# Patient Record
Sex: Female | Born: 2000 | Race: Black or African American | Hispanic: No | Marital: Single | Smoking: Never smoker
Health system: Southern US, Community
[De-identification: ages and names within clinical notes are randomized; demographics above are authoritative.]

## PROBLEM LIST (undated history)

## (undated) ENCOUNTER — Emergency Department (HOSPITAL_COMMUNITY): Admission: EM | Source: Home / Self Care

## (undated) HISTORY — PX: HYMENECTOMY: SHX987

---
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
## 2020-04-08 DIAGNOSIS — F641 Gender identity disorder in adolescence and adulthood: Secondary | ICD-10-CM | POA: Diagnosis not present

## 2020-04-08 DIAGNOSIS — R69 Illness, unspecified: Secondary | ICD-10-CM | POA: Diagnosis not present

## 2020-05-06 DIAGNOSIS — F641 Gender identity disorder in adolescence and adulthood: Secondary | ICD-10-CM | POA: Diagnosis not present

## 2020-05-06 DIAGNOSIS — R69 Illness, unspecified: Secondary | ICD-10-CM | POA: Diagnosis not present

## 2020-06-06 DIAGNOSIS — R69 Illness, unspecified: Secondary | ICD-10-CM | POA: Diagnosis not present

## 2020-10-12 ENCOUNTER — Other Ambulatory Visit: Payer: Self-pay

## 2020-10-12 ENCOUNTER — Emergency Department (HOSPITAL_COMMUNITY)
Admission: EM | Admit: 2020-10-12 | Discharge: 2020-10-13 | Discharge status: Against Medical Advice | Payer: Federal, State, Local not specified - PPO

## 2020-10-13 ENCOUNTER — Emergency Department (HOSPITAL_COMMUNITY)
Admission: EM | Admit: 2020-10-13 | Discharge: 2020-10-13 | Disposition: A | Discharge status: Home / Self Care | Payer: Federal, State, Local not specified - PPO | Attending: Emergency Medicine | Admitting: Emergency Medicine

## 2020-10-13 ENCOUNTER — Encounter (HOSPITAL_COMMUNITY): Payer: Self-pay | Admitting: *Deleted

## 2020-10-13 DIAGNOSIS — Z5321 Procedure and treatment not carried out due to patient leaving prior to being seen by health care provider: Secondary | ICD-10-CM | POA: Insufficient documentation

## 2020-10-13 DIAGNOSIS — R109 Unspecified abdominal pain: Secondary | ICD-10-CM | POA: Insufficient documentation

## 2020-10-13 DIAGNOSIS — R112 Nausea with vomiting, unspecified: Secondary | ICD-10-CM | POA: Diagnosis present

## 2020-10-13 DIAGNOSIS — R197 Diarrhea, unspecified: Secondary | ICD-10-CM | POA: Diagnosis not present

## 2020-10-13 LAB — CBC WITH DIFFERENTIAL/PLATELET
Abs Immature Granulocytes: 0.04 10*3/uL (ref 0.00–0.07)
Basophils Absolute: 0 10*3/uL (ref 0.0–0.1)
Basophils Relative: 0 %
Eosinophils Absolute: 0 10*3/uL (ref 0.0–0.5)
Eosinophils Relative: 0 %
HCT: 45.1 % (ref 36.0–46.0)
Hemoglobin: 14.9 g/dL (ref 12.0–15.0)
Immature Granulocytes: 0 %
Lymphocytes Relative: 14 %
Lymphs Abs: 1.7 10*3/uL (ref 0.7–4.0)
MCH: 29.9 pg (ref 26.0–34.0)
MCHC: 33 g/dL (ref 30.0–36.0)
MCV: 90.6 fL (ref 80.0–100.0)
Monocytes Absolute: 1.2 10*3/uL — ABNORMAL HIGH (ref 0.1–1.0)
Monocytes Relative: 10 %
Neutro Abs: 9.4 10*3/uL — ABNORMAL HIGH (ref 1.7–7.7)
Neutrophils Relative %: 76 %
Platelets: 457 10*3/uL — ABNORMAL HIGH (ref 150–400)
RBC: 4.98 MIL/uL (ref 3.87–5.11)
RDW: 12.5 % (ref 11.5–15.5)
WBC: 12.3 10*3/uL — ABNORMAL HIGH (ref 4.0–10.5)
nRBC: 0 % (ref 0.0–0.2)

## 2020-10-13 LAB — I-STAT BETA HCG BLOOD, ED (MC, WL, AP ONLY): I-stat hCG, quantitative: <5 m[IU]/mL (ref <5)

## 2020-10-13 LAB — URINALYSIS, ROUTINE W REFLEX MICROSCOPIC
Bilirubin Urine: NEGATIVE
Glucose, UA: 50 mg/dL — AB
Ketones, ur: 80 mg/dL — AB
Leukocytes,Ua: NEGATIVE
Nitrite: NEGATIVE
Protein, ur: 100 mg/dL — AB
Specific Gravity, Urine: 1.034 — ABNORMAL HIGH (ref 1.005–1.030)
pH: 6 (ref 5.0–8.0)

## 2020-10-13 LAB — COMPREHENSIVE METABOLIC PANEL
ALT: 13 U/L (ref 0–44)
AST: 19 U/L (ref 15–41)
Albumin: 4.4 g/dL (ref 3.5–5.0)
Alkaline Phosphatase: 56 U/L (ref 38–126)
Anion gap: 13 (ref 5–15)
BUN: 15 mg/dL (ref 6–20)
CO2: 24 mmol/L (ref 22–32)
Calcium: 9.9 mg/dL (ref 8.9–10.3)
Chloride: 102 mmol/L (ref 98–111)
Creatinine, Ser: 0.88 mg/dL (ref 0.44–1.00)
GFR, Estimated: >60 mL/min (ref >60)
Glucose, Bld: 103 mg/dL — ABNORMAL HIGH (ref 70–99)
Potassium: 3.1 mmol/L — ABNORMAL LOW (ref 3.5–5.1)
Sodium: 139 mmol/L (ref 135–145)
Total Bilirubin: 1 mg/dL (ref 0.3–1.2)
Total Protein: 7.7 g/dL (ref 6.5–8.1)

## 2020-10-13 LAB — LIPASE, BLOOD: Lipase: 28 U/L (ref 11–51)

## 2020-10-13 MED ORDER — ONDANSETRON 4 MG PO TBDP
4.0000 mg | ORAL_TABLET | Freq: Once | ORAL | Status: AC
  Administered 2020-10-13: 4 mg via ORAL
  Filled 2020-10-13: qty 1

## 2020-10-13 NOTE — ED Notes (Signed)
Called patient for vitals, no response.  

## 2020-10-13 NOTE — ED Notes (Signed)
Called pt 3x no answer moving off the floor 

## 2020-10-13 NOTE — ED Triage Notes (Signed)
Pt reports vomiting hourly x 3 days. Initially had diarrhea and reports bilateral mid abd pain. Minimal relief with zofran and phenergan.

## 2020-10-13 NOTE — ED Provider Notes (Signed)
Emergency Medicine Provider Triage Evaluation Note  Shannon Bowen , a 20 y.o. female  was evaluated in triage.  Pt complains of nausea, vomiting, and diarrhea.  Patient has had symptoms of the last 3 days.  Reports that she is vomiting hourly.  Patient had minimal relief with Zofran and Phenergan.  Patient describes emesis as bilious.  States that urgently emesis was black in color.  Patient has noticed small amounts of bright red blood mixed in with her emesis.  Patient reports resolution of her diarrhea.  Denies any recent antibiotic use or travel.  Patient endorses daily marijuana use.  LMP 10/13/2020  Review of Systems  Positive: Generalized abdominal pain, nausea, vomiting, diarrhea, chills Negative: Fever, melena, blood in stool, dysuria, hematuria, urinary frequency  Physical Exam  BP 119/80 (BP Location: Right Arm)   Pulse (!) 59   Temp 98.5 F (36.9 C) (Oral)   Resp 20   LMP 10/13/2020   SpO2 100%  Gen:   Awake, no distress   Resp:  Normal effort  MSK:   Moves extremities without difficulty  Other:  Abdomen soft, nondistended, nontender, no peritoneal signs.  Medical Decision Making  Medically screening exam initiated at 9:22 AM.  Appropriate orders placed.  Shannon Bowen was informed that the remainder of the evaluation will be completed by another provider, this initial triage assessment does not replace that evaluation, and the importance of remaining in the ED until their evaluation is complete.  The patient appears stable so that the remainder of the work up may be completed by another provider.      Haskel Schroeder, PA-C 10/13/20 [id number]    Benjiman Core, MD 10/13/20 [phone]
